# Patient Record
Sex: Female | Born: 1955 | Race: White | Hispanic: No | Marital: Married | Smoking: Never smoker
Health system: Southern US, Community
[De-identification: ages and names within clinical notes are randomized; demographics above are authoritative.]

## PROBLEM LIST (undated history)

## (undated) DIAGNOSIS — D469 Myelodysplastic syndrome, unspecified: Secondary | ICD-10-CM

## (undated) DIAGNOSIS — M1990 Unspecified osteoarthritis, unspecified site: Secondary | ICD-10-CM

## (undated) HISTORY — PX: TOTAL HIP ARTHROPLASTY: SHX124

---

## 2016-08-09 DIAGNOSIS — S73005A Unspecified dislocation of left hip, initial encounter: Secondary | ICD-10-CM | POA: Diagnosis not present

## 2016-08-09 DIAGNOSIS — T148XXA Other injury of unspecified body region, initial encounter: Secondary | ICD-10-CM | POA: Diagnosis not present

## 2016-08-10 ENCOUNTER — Encounter (HOSPITAL_COMMUNITY): Payer: Self-pay | Admitting: Emergency Medicine

## 2016-08-10 ENCOUNTER — Emergency Department (HOSPITAL_COMMUNITY): Payer: 59

## 2016-08-10 ENCOUNTER — Emergency Department (HOSPITAL_COMMUNITY)
Admission: EM | Admit: 2016-08-10 | Discharge: 2016-08-10 | Disposition: A | Discharge status: Home / Self Care | Payer: 59 | Attending: Emergency Medicine | Admitting: Emergency Medicine

## 2016-08-10 DIAGNOSIS — Y92002 Bathroom of unspecified non-institutional (private) residence single-family (private) house as the place of occurrence of the external cause: Secondary | ICD-10-CM | POA: Insufficient documentation

## 2016-08-10 DIAGNOSIS — Y999 Unspecified external cause status: Secondary | ICD-10-CM | POA: Insufficient documentation

## 2016-08-10 DIAGNOSIS — Z96649 Presence of unspecified artificial hip joint: Secondary | ICD-10-CM | POA: Diagnosis not present

## 2016-08-10 DIAGNOSIS — Y9389 Activity, other specified: Secondary | ICD-10-CM | POA: Diagnosis not present

## 2016-08-10 DIAGNOSIS — S73005A Unspecified dislocation of left hip, initial encounter: Secondary | ICD-10-CM | POA: Insufficient documentation

## 2016-08-10 DIAGNOSIS — S79912A Unspecified injury of left hip, initial encounter: Secondary | ICD-10-CM | POA: Diagnosis present

## 2016-08-10 DIAGNOSIS — X509XXA Other and unspecified overexertion or strenuous movements or postures, initial encounter: Secondary | ICD-10-CM | POA: Diagnosis not present

## 2016-08-10 DIAGNOSIS — IMO0001 Reserved for inherently not codable concepts without codable children: Secondary | ICD-10-CM

## 2016-08-10 DIAGNOSIS — Z79899 Other long term (current) drug therapy: Secondary | ICD-10-CM | POA: Diagnosis not present

## 2016-08-10 HISTORY — DX: Unspecified osteoarthritis, unspecified site: M19.90

## 2016-08-10 LAB — URINALYSIS, ROUTINE W REFLEX MICROSCOPIC
Bilirubin Urine: NEGATIVE
Glucose, UA: NEGATIVE mg/dL
HGB URINE DIPSTICK: NEGATIVE
KETONES UR: NEGATIVE mg/dL
LEUKOCYTES UA: NEGATIVE
Nitrite: NEGATIVE
PROTEIN: NEGATIVE mg/dL
Specific Gravity, Urine: 1.01 (ref 1.005–1.030)
pH: 6 (ref 5.0–8.0)

## 2016-08-10 MED ORDER — SODIUM CHLORIDE 0.9 % IV BOLUS (SEPSIS)
500.0000 mL | INTRAVENOUS | Status: AC
  Administered 2016-08-10: 500 mL via INTRAVENOUS

## 2016-08-10 MED ORDER — FENTANYL 25 MCG/HR TD PT72
50.0000 ug | MEDICATED_PATCH | TRANSDERMAL | Status: DC
  Administered 2016-08-10: 50 ug via TRANSDERMAL
  Filled 2016-08-10: qty 2

## 2016-08-10 MED ORDER — FENTANYL CITRATE (PF) 100 MCG/2ML IJ SOLN
50.0000 ug | INTRAMUSCULAR | Status: DC | PRN
  Administered 2016-08-10 (×2): 50 ug via INTRAVENOUS
  Filled 2016-08-10 (×2): qty 2

## 2016-08-10 MED ORDER — FENTANYL CITRATE (PF) 100 MCG/2ML IJ SOLN
25.0000 ug | Freq: Once | INTRAMUSCULAR | Status: AC
  Administered 2016-08-10: 25 ug via INTRAVENOUS
  Filled 2016-08-10: qty 2

## 2016-08-10 NOTE — ED Provider Notes (Signed)
[facility] DEPT Provider Note   CSN: [id number] Arrival date & time: 08/10/16  [id number]     History   Chief Complaint Chief Complaint  Patient presents with  . Hip Injury    HPI Darlene Fuller is a 61 y.o. female.  HPI  Patient presents with left hip pain. Patient has a notable history of multiple prior replacements, revisions, and had infection 4 months ago requiring drainage. However, the patient was in her usual state of health until this morning, when she felt sudden onset of pain after standing from the commode. Since that time she has had focal pain in the left hip, inability to walk, bear weight. Pain is severe, no medication taken thus far. No distal loss of sensation. No other injuries. Patient is awake and alert, provides the history herself, with the assistance of family members. In addition, during my initial evaluation the patient had her home orthopedic team physician on speaker phone. He reiterates that the patient has a complex history of hip replacement, revision, infection, and he advocates for return to [facility] for definitive care.   Past Medical History:  Diagnosis Date  . Arthritis     There are no active problems to display for this patient.   Past Surgical History:  Procedure Laterality Date  . TOTAL HIP ARTHROPLASTY      OB History    No data available       Home Medications    Prior to Admission medications   Medication Sig Start Date End Date Taking? Authorizing Provider  Apremilast (OTEZLA) 30 MG TABS Take 30 mg by mouth 2 (two) times daily.   Yes Historical Provider, MD  busPIRone (BUSPAR) 30 MG tablet Take 30 mg by mouth 2 (two) times daily.   Yes Historical Provider, MD  calcium-vitamin D (OSCAL WITH D) 500-200 MG-UNIT tablet Take 1 tablet by mouth daily with breakfast.   Yes Historical Provider, MD  cefadroxil (DURICEF) 500 MG capsule Take 500 mg by mouth 2 (two) times daily.   Yes Historical Provider, MD  metoprolol (LOPRESSOR)  50 MG tablet Take 50 mg by mouth 2 (two) times daily.   Yes Historical Provider, MD  Multiple Vitamin (MULTIVITAMIN WITH MINERALS) TABS tablet Take 1 tablet by mouth daily.   Yes Historical Provider, MD  pantoprazole (PROTONIX) 40 MG tablet Take 40 mg by mouth daily.   Yes Historical Provider, MD  QUEtiapine (SEROQUEL XR) 300 MG 24 hr tablet Take 300 mg by mouth at bedtime.   Yes Historical Provider, MD  QUEtiapine (SEROQUEL) 300 MG tablet Take 300 mg by mouth at bedtime.   Yes Historical Provider, MD  traMADol (ULTRAM) 50 MG tablet Take 50 mg by mouth every 6 (six) hours as needed for moderate pain.   Yes Historical Provider, MD    Family History No family history on file.  Social History Social History  Substance Use Topics  . Smoking status: Never Smoker  . Smokeless tobacco: Never Used  . Alcohol use Yes     Comment: "occasional"      Allergies   Patient has no known allergies.   Review of Systems Review of Systems  Constitutional:       Per HPI, otherwise negative  HENT:       Per HPI, otherwise negative  Respiratory:       Per HPI, otherwise negative  Cardiovascular:       Per HPI, otherwise negative  Gastrointestinal: Negative for vomiting.  Endocrine:  Psoriatic arthritis, myelodysplasia, neither with active treatment  Genitourinary:       Neg aside from HPI   Musculoskeletal:       Per HPI, otherwise negative  Skin: Negative.   Neurological: Negative for syncope.     Physical Exam Updated Vital Signs BP 113/72 (BP Location: Right Arm)   Pulse 77   Temp 97.5 F (36.4 C) (Oral)   Resp 12   Ht 5\' 7"  (1.702 m)   Wt 145 lb (65.8 kg)   SpO2 100%   BMI 22.71 kg/m   Physical Exam  Constitutional: She is oriented to person, place, and time. She appears well-developed and well-nourished. No distress.  HENT:  Head: Normocephalic and atraumatic.  Eyes: Conjunctivae and EOM are normal.  Cardiovascular: Normal rate and regular rhythm.     Pulmonary/Chest: Effort normal and breath sounds normal. No stridor. No respiratory distress.  Abdominal: She exhibits no distension.  Musculoskeletal: She exhibits deformity. She exhibits no edema.  L hip w two drains in place, sero-sang drainage.  Neurological: She is alert and oriented to person, place, and time. No cranial nerve deficit.  L LE distally n/v intact  Skin: Skin is warm and dry.  Psychiatric: She has a normal mood and affect.  Nursing note and vitals reviewed.    ED Treatments / Results   EKG  EKG Interpretation  Date/Time:  Sunday August 10 2016 07:58:02 EST Ventricular Rate:  68 PR Interval:    QRS Duration: 101 QT Interval:  393 QTC Calculation: 418 R Axis:   37 Text Interpretation:  Sinus rhythm RSR' in V1 or V2, probably normal variant Artifact Borderline Confirmed by Carmin Muskrat  MD [phone]) on 08/10/2016 8:22:09 AM       Radiology Dg Hip Unilat W Or Wo Pelvis 2-3 Views Left  Result Date: 08/10/2016 CLINICAL DATA:  "LEFT hip popped out of place" while sitting on the toilet this morning, has happened 5 times in past, post LEFT THR, throbbing pain all over LEFT hip EXAM: DG HIP (WITH OR WITHOUT PELVIS) 2-3V LEFT COMPARISON:  Non FINDINGS: Diffuse osseous demineralization. BILATERAL hip prostheses. Superolateral dislocation of the femoral component of the LEFT hip prosthesis. Displaced liner ring of LEFT hip prosthesis. RIGHT hip prosthesis appears normally located. No fracture or bone destruction. Pelvis appears intact. SI joints symmetric. Degenerative changes at visualized lower lumbar spine. IMPRESSION: Superolateral dislocation of LEFT hip prosthesis with displaced of the liner ring at the LEFT hip prosthesis. Electronically Signed   By: Lavonia Dana M.D.   On: 08/10/2016 08:57    Procedures Procedures (including critical care time)  Medications Ordered in ED Medications  fentaNYL (SUBLIMAZE) injection 25 mcg (not administered)  sodium chloride  0.9 % bolus 500 mL (not administered)     Initial Impression / Assessment and Plan / ED Course  I have reviewed the triage vital signs and the nursing notes.  Pertinent labs & imaging results that were available during my care of the patient were reviewed by me and considered in my medical decision making (see chart for details).  After the initial evaluation and discussion with the patient and her husband, I reviewed the XR.  With obvious dislocation, complicated by displaced (broken?) liner ring I then discussed her presentation with our orthopedist, and we reviewed the images.  There is no similar set of hardware here making revision in this facility not an option.  We, together with the patient, her husband and her orthopedist discussed options for reduction,  and it was agreed that the patient would return to [facility] for the procedure.  Patient and her husband were both aware of and in favor of this plan.  Her home orthopedic team accepted the patient for transfer.  Subsequently we spent a considerable amount of time (with much assistance) identifying possible transfer mechanisms.  Eventually family decided to go via private vehicle.  Throughout this period the patient had decent pain control with narcotics.  Prior to transfer she had a fentanyl patch applied and was provided a prescription for additional meds, if needed en route.  Final Clinical Impressions(s) / ED Diagnoses  Dislocation of hip, prosthetic, L.   Carmin Muskrat, MD 08/11/16 [phone]

## 2016-08-10 NOTE — ED Notes (Signed)
Pt attempting to urinate with female urinal.

## 2016-08-10 NOTE — ED Triage Notes (Signed)
Pt states she got up this am to use the bathroom, bent over and felt her left hip dislocate. Per pt, she has had multiple surgeries related to her hip. Pt has JP drains in place in left hip for infection. Left leg foot rotated inward.

## 2016-08-10 NOTE — Consult Note (Signed)
Reason for Consult: Left hip dislocation Referring Physician: Dr [name]  61 y.o. female.  HPI: Darlene Fuller is a 61 year old female with psoriatic arthritis who underwent left total hip replacement in 2014.  2 weeks after that hip replacement she sustained a fall with periprosthetic fracture.  Femur was revised to a long stem at that time.  She subsequently developed episodes of dislocation within a month or 2 after having that long stem prosthesis placed.  After several dislocations and reductions the patient underwent revision of the cup as well as placement of a constrained liner.  The patient developed an infection in the left hip in October of this year.  She underwent I&D with drain placement.  She is currently on oral antibiotics.  She has had a drain placed and removed several times currently the drain is in place.  She was getting up from the bathroom this morning when she sustained a dislocation.  She presents now for further management.  Her husband is here with her.  She is visiting from [place] where she has received all of her treatment.  The patient does live in [place].  I have discussed this case with the resident of the attending of record at [facility].   Past Medical History:  Diagnosis Date  . Arthritis     Past Surgical History:  Procedure Laterality Date  . TOTAL HIP ARTHROPLASTY      No family history on file.  Social History:  reports that she has never smoked. She has never used smokeless tobacco. She reports that she drinks alcohol. Her drug history is not on file.  Allergies: No Known Allergies  Medications: I have reviewed the patient's current medications.  No results found for this or any previous visit (from the past 48 hour(s)).  Dg Hip Unilat W Or Wo Pelvis 2-3 Views Left  Result Date: 08/10/2016 CLINICAL DATA:  "LEFT hip popped out of place" while sitting on the toilet this morning, has happened 5 times in past,  post LEFT THR, throbbing pain all over LEFT hip EXAM: DG HIP (WITH OR WITHOUT PELVIS) 2-3V LEFT COMPARISON:  Non FINDINGS: Diffuse osseous demineralization. BILATERAL hip prostheses. Superolateral dislocation of the femoral component of the LEFT hip prosthesis. Displaced liner ring of LEFT hip prosthesis. RIGHT hip prosthesis appears normally located. No fracture or bone destruction. Pelvis appears intact. SI joints symmetric. Degenerative changes at visualized lower lumbar spine. IMPRESSION: Superolateral dislocation of LEFT hip prosthesis with displaced of the liner ring at the LEFT hip prosthesis. Electronically Signed   By: Darlene Fuller M.D.   On: 08/10/2016 08:57    Review of Systems  Musculoskeletal: Positive for joint pain.  All other systems reviewed and are negative.  Blood pressure 104/72, pulse 64, temperature 97.5 F (36.4 C), temperature source Oral, resp. rate 11, height 5\' 7"  (1.702 m), weight 145 lb (65.8 kg), SpO2 99 %. Physical Exam  Constitutional: She appears well-developed.  HENT:  Head: Normocephalic.  Eyes: Pupils are equal, round, and reactive to light.  Neck: Normal range of motion.  Cardiovascular: Normal rate.   Respiratory: Effort normal.  Neurological: She is alert.  Skin: Skin is warm.  Psychiatric: She has a normal mood and affect.  Examination of the left leg demonstrates shortening and internal rotation.  Ankle dorsiflexion plantar flexion is intact.  Pedal pulses palpable.  Incision is intact with 2 drains coming out.  There is no redness fluctuance or erythema around the incision.  Assessment/Plan:  Plan at this time is for transfer to [place].  I discussed this at length with Darlene Fuller who is the resident on the service at this time.  Treatment of this problem will be very complicated with removal of well fixed long stem curved stem as well as possible cup revision.  There is also the complicating factor of  infection for which drains are in place.  We don't really have that level of expertise here to manage this particular complicated problem.  She would be best served being transferred today for admission and subsequent treatment.  Once we arrived at suitable transport I will call and let the resident note that she is coming.  She is agreeable to the plan.  Foley catheter will be placed for comfort along with oral pain medicines.  Darlene Fuller 08/10/2016, 12:22 PM

## 2016-08-10 NOTE — ED Notes (Signed)
Dr Vanita Panda at bedside speaking with family.

## 2016-08-10 NOTE — ED Notes (Signed)
Pt states she only has pain with movement, comfortable at this time.

## 2018-05-31 ENCOUNTER — Other Ambulatory Visit: Payer: Self-pay

## 2018-05-31 ENCOUNTER — Encounter (HOSPITAL_COMMUNITY): Payer: Self-pay

## 2018-05-31 ENCOUNTER — Emergency Department (HOSPITAL_COMMUNITY)
Admission: EM | Admit: 2018-05-31 | Discharge: 2018-05-31 | Disposition: A | Discharge status: Home / Self Care | Payer: BLUE CROSS/BLUE SHIELD | Attending: Emergency Medicine | Admitting: Emergency Medicine

## 2018-05-31 DIAGNOSIS — D469 Myelodysplastic syndrome, unspecified: Secondary | ICD-10-CM | POA: Insufficient documentation

## 2018-05-31 DIAGNOSIS — Z79899 Other long term (current) drug therapy: Secondary | ICD-10-CM | POA: Diagnosis not present

## 2018-05-31 DIAGNOSIS — Z452 Encounter for adjustment and management of vascular access device: Secondary | ICD-10-CM

## 2018-05-31 DIAGNOSIS — L4050 Arthropathic psoriasis, unspecified: Secondary | ICD-10-CM | POA: Diagnosis not present

## 2018-05-31 HISTORY — DX: Myelodysplastic syndrome, unspecified: D46.9

## 2018-05-31 LAB — COMPREHENSIVE METABOLIC PANEL
ALBUMIN: 3.5 g/dL (ref 3.5–5.0)
ALK PHOS: 95 U/L (ref 38–126)
ALT: 17 U/L (ref 0–44)
AST: 33 U/L (ref 15–41)
Anion gap: 12 (ref 5–15)
BUN: 19 mg/dL (ref 8–23)
CALCIUM: 9.5 mg/dL (ref 8.9–10.3)
CO2: 26 mmol/L (ref 22–32)
CREATININE: 0.83 mg/dL (ref 0.44–1.00)
Chloride: 101 mmol/L (ref 98–111)
GFR calc Af Amer: >60 mL/min (ref >60)
GFR calc non Af Amer: >60 mL/min (ref >60)
GLUCOSE: 93 mg/dL (ref 70–99)
Potassium: 4.3 mmol/L (ref 3.5–5.1)
Sodium: 139 mmol/L (ref 135–145)
Total Bilirubin: 0.5 mg/dL (ref 0.3–1.2)
Total Protein: 7.4 g/dL (ref 6.5–8.1)

## 2018-05-31 LAB — CBC WITH DIFFERENTIAL/PLATELET
Abs Immature Granulocytes: 0.04 10*3/uL (ref 0.00–0.07)
Basophils Absolute: 0.1 10*3/uL (ref 0.0–0.1)
Basophils Relative: 1 %
EOS ABS: 0.5 10*3/uL (ref 0.0–0.5)
Eosinophils Relative: 7 %
HEMATOCRIT: 33.3 % — AB (ref 36.0–46.0)
Hemoglobin: 9.4 g/dL — ABNORMAL LOW (ref 12.0–15.0)
IMMATURE GRANULOCYTES: 1 %
LYMPHS ABS: 2.4 10*3/uL (ref 0.7–4.0)
Lymphocytes Relative: 32 %
MCH: 22.9 pg — ABNORMAL LOW (ref 26.0–34.0)
MCHC: 28.2 g/dL — ABNORMAL LOW (ref 30.0–36.0)
MCV: 81.2 fL (ref 80.0–100.0)
MONOS PCT: 8 %
Monocytes Absolute: 0.6 10*3/uL (ref 0.1–1.0)
Neutro Abs: 3.8 10*3/uL (ref 1.7–7.7)
Neutrophils Relative %: 51 %
Platelets: 620 10*3/uL — ABNORMAL HIGH (ref 150–400)
RBC: 4.1 MIL/uL (ref 3.87–5.11)
RDW: 14.8 % (ref 11.5–15.5)
WBC: 7.3 10*3/uL (ref 4.0–10.5)
nRBC: 0 % (ref 0.0–0.2)

## 2018-05-31 LAB — SEDIMENTATION RATE: Sed Rate: 55 mm/hr — ABNORMAL HIGH (ref 0–22)

## 2018-05-31 LAB — CK: CK TOTAL: 192 U/L (ref 38–234)

## 2018-05-31 LAB — C-REACTIVE PROTEIN: CRP: 1.8 mg/dL — ABNORMAL HIGH (ref <1.0)

## 2018-05-31 MED ORDER — HEPARIN SOD (PORK) LOCK FLUSH 100 UNIT/ML IV SOLN
250.0000 [IU] | INTRAVENOUS | Status: DC | PRN
  Filled 2018-05-31: qty 2.5

## 2018-05-31 NOTE — Discharge Instructions (Signed)
As discussed, your evaluation today has been largely reassuring.  But, it is important that you monitor your condition carefully, and do not hesitate to return to the ED if you develop new, or concerning changes in your condition. ? ?Otherwise, please follow-up with your physician for appropriate ongoing care. ? ?

## 2018-05-31 NOTE — ED Triage Notes (Signed)
Pt presents for routine PICC line care. Pt states she is from [facility] but is visiting her sick mother here in the hospital. Pt states that normally her home health nurse takes care of it. Pt states she has the PICC for antibiotic therapy for an infected his replacement. 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 Pt ambulatory with steady gait.

## 2018-05-31 NOTE — ED Provider Notes (Signed)
[facility] EMERGENCY DEPARTMENT Provider Note   CSN: [id number] Arrival date & time: 05/31/18  1319     History   Chief Complaint Chief Complaint  Patient presents with  . Follow-up    HPI Darlene Fuller is a 62 y.o. female.  HPI  Patient presents with [facility] for attention to her PICC line dressing, as well as blood draw. Patient has multiple medical issues, and indeed, I have seen this patient before, about 1 year ago. Patient has history of psoriatic arthritis, myelodysplastic syndrome, and left hip arthroplasty with complications. When I saw the patient 1 year ago, patient had dislocation of the hip. She notes that she has been diagnosed with infected hip, and is currently receiving daptomycin and rifampin for infection. She has a PICC line in place in her right arm, and is due for dressing change today. She denies interval changes including fever, nausea, vomiting, new pain. Patient is also scheduled for blood draw, today. Patient is in from out of town, has no Field seismologist, or care team.   Past Medical History:  Diagnosis Date  . Arthritis     There are no active problems to display for this patient.   Past Surgical History:  Procedure Laterality Date  . TOTAL HIP ARTHROPLASTY       OB History   None      Home Medications    Prior to Admission medications   Medication Sig Start Date End Date Taking? Authorizing Provider  Apremilast (OTEZLA) 30 MG TABS Take 30 mg by mouth 2 (two) times daily.    [provider]  busPIRone (BUSPAR) 30 MG tablet Take 30 mg by mouth 2 (two) times daily.    [provider]  calcium-vitamin D (OSCAL WITH D) 500-200 MG-UNIT tablet Take 1 tablet by mouth daily with breakfast.    [provider]  cefadroxil (DURICEF) 500 MG capsule Take 500 mg by mouth 2 (two) times daily.    [provider]  metoprolol (LOPRESSOR) 50 MG tablet Take 50 mg by mouth 2 (two) times daily.     [provider]  Multiple Vitamin (MULTIVITAMIN WITH MINERALS) TABS tablet Take 1 tablet by mouth daily.    [provider]  pantoprazole (PROTONIX) 40 MG tablet Take 40 mg by mouth daily.    [provider]  QUEtiapine (SEROQUEL XR) 300 MG 24 hr tablet Take 300 mg by mouth at bedtime.    [provider]  QUEtiapine (SEROQUEL) 300 MG tablet Take 300 mg by mouth at bedtime.    [provider]  traMADol (ULTRAM) 50 MG tablet Take 50 mg by mouth every 6 (six) hours as needed for moderate pain.    [provider]    Family History No family history on file.  Social History Social History   Tobacco Use  . Smoking status: Never Smoker  . Smokeless tobacco: Never Used  Substance Use Topics  . Alcohol use: Yes    Comment: "occasional"   . Drug use: Not on file     Allergies   Patient has no known allergies.   Review of Systems Review of Systems  Allergic/Immunologic: Positive for immunocompromised state.  Hematological:       MDS     Physical Exam Updated Vital Signs There were no vitals taken for this visit.  Physical Exam  Constitutional: She is oriented to person, place, and time. She appears well-developed and well-nourished. No distress.  HENT:  Head: Normocephalic and  atraumatic.  Eyes: Conjunctivae are normal.  Pulmonary/Chest: Effort normal.  Musculoskeletal: She exhibits no deformity.  Neurological: She is alert and oriented to person, place, and time. No cranial nerve deficit.  Skin: Skin is warm and dry.  No evidence of infection, R PICC  Psychiatric: She has a normal mood and affect.  Nursing note and vitals reviewed.    ED Treatments / Results  Labs (all labs ordered are listed, but only abnormal results are displayed) Labs Reviewed  CBC WITH DIFFERENTIAL/PLATELET - Abnormal; Notable for the following components:      Result Value   Hemoglobin 9.4 (*)    HCT 33.3 (*)    MCH 22.9 (*)    MCHC 28.2  (*)    Platelets 620 (*)    All other components within normal limits  COMPREHENSIVE METABOLIC PANEL  CK  C-REACTIVE PROTEIN  SEDIMENTATION RATE     Procedures Procedures (including critical care time)  Medications Ordered in ED Medications  heparin lock flush 100 unit/mL (has no administration in time range)     Initial Impression / Assessment and Plan / ED Course  I have reviewed the triage vital signs and the nursing notes.  Pertinent labs & imaging results that were available during my care of the patient were reviewed by me and considered in my medical decision making (see chart for details).   Well-appearing female with multiple medical issues including infected left hip arthroplasty, myelodysplastic syndrome, and psoriatic arthritis presents with request for wound care of her right PICC line, and blood draw Labs reassuring, and given her denial of new complaints, low suspicion for acute new pathology. Patient also acknowledges ongoing compliance with her medication regimen. Patient received dressing change, blood draw, and with reassuring vital signs, she was discharged to follow-up with her primary care team in [place].  Final Clinical Impressions(s) / ED Diagnoses  Encounter for assessment of PICC   Carmin Muskrat, MD 05/31/18 1506

## 2018-06-07 ENCOUNTER — Other Ambulatory Visit: Payer: Self-pay

## 2018-06-07 ENCOUNTER — Emergency Department (HOSPITAL_COMMUNITY)
Admission: EM | Admit: 2018-06-07 | Discharge: 2018-06-07 | Disposition: A | Discharge status: Home / Self Care | Payer: BLUE CROSS/BLUE SHIELD | Attending: Emergency Medicine | Admitting: Emergency Medicine

## 2018-06-07 ENCOUNTER — Encounter (HOSPITAL_COMMUNITY): Payer: Self-pay

## 2018-06-07 DIAGNOSIS — R2681 Unsteadiness on feet: Secondary | ICD-10-CM | POA: Diagnosis present

## 2018-06-07 DIAGNOSIS — D469 Myelodysplastic syndrome, unspecified: Secondary | ICD-10-CM | POA: Insufficient documentation

## 2018-06-07 DIAGNOSIS — D649 Anemia, unspecified: Secondary | ICD-10-CM

## 2018-06-07 DIAGNOSIS — Z79899 Other long term (current) drug therapy: Secondary | ICD-10-CM | POA: Diagnosis not present

## 2018-06-07 DIAGNOSIS — Z96649 Presence of unspecified artificial hip joint: Secondary | ICD-10-CM | POA: Insufficient documentation

## 2018-06-07 LAB — CBC WITH DIFFERENTIAL/PLATELET
Abs Immature Granulocytes: 0.02 10*3/uL (ref 0.00–0.07)
BASOS PCT: 1 %
Basophils Absolute: 0.1 10*3/uL (ref 0.0–0.1)
Eosinophils Absolute: 0.7 10*3/uL — ABNORMAL HIGH (ref 0.0–0.5)
Eosinophils Relative: 10 %
HCT: 31.9 % — ABNORMAL LOW (ref 36.0–46.0)
Hemoglobin: 9.1 g/dL — ABNORMAL LOW (ref 12.0–15.0)
Immature Granulocytes: 0 %
Lymphocytes Relative: 37 %
Lymphs Abs: 2.4 10*3/uL (ref 0.7–4.0)
MCH: 22.5 pg — ABNORMAL LOW (ref 26.0–34.0)
MCHC: 28.5 g/dL — ABNORMAL LOW (ref 30.0–36.0)
MCV: 78.8 fL — ABNORMAL LOW (ref 80.0–100.0)
Monocytes Absolute: 0.4 10*3/uL (ref 0.1–1.0)
Monocytes Relative: 6 %
Neutro Abs: 3 10*3/uL (ref 1.7–7.7)
Neutrophils Relative %: 46 %
PLATELETS: 460 10*3/uL — AB (ref 150–400)
RBC: 4.05 MIL/uL (ref 3.87–5.11)
RDW: 14.6 % (ref 11.5–15.5)
WBC: 6.5 10*3/uL (ref 4.0–10.5)
nRBC: 0 % (ref 0.0–0.2)

## 2018-06-07 LAB — CK: Total CK: 177 U/L (ref 38–234)

## 2018-06-07 LAB — COMPREHENSIVE METABOLIC PANEL
ALK PHOS: 86 U/L (ref 38–126)
ALT: 20 U/L (ref 0–44)
AST: 36 U/L (ref 15–41)
Albumin: 3.4 g/dL — ABNORMAL LOW (ref 3.5–5.0)
Anion gap: 12 (ref 5–15)
BUN: 23 mg/dL (ref 8–23)
CO2: 27 mmol/L (ref 22–32)
Calcium: 9.5 mg/dL (ref 8.9–10.3)
Chloride: 99 mmol/L (ref 98–111)
Creatinine, Ser: 0.73 mg/dL (ref 0.44–1.00)
GFR calc Af Amer: >60 mL/min (ref >60)
GFR calc non Af Amer: >60 mL/min (ref >60)
Glucose, Bld: 98 mg/dL (ref 70–99)
Potassium: 3.8 mmol/L (ref 3.5–5.1)
SODIUM: 138 mmol/L (ref 135–145)
Total Bilirubin: 0.2 mg/dL — ABNORMAL LOW (ref 0.3–1.2)
Total Protein: 6.9 g/dL (ref 6.5–8.1)

## 2018-06-07 LAB — SEDIMENTATION RATE: Sed Rate: 47 mm/hr — ABNORMAL HIGH (ref 0–22)

## 2018-06-07 LAB — C-REACTIVE PROTEIN: CRP: <0.8 mg/dL (ref <1.0)

## 2018-06-07 NOTE — ED Triage Notes (Signed)
Pt here for weekly PICC line dressing change and weekly labs. Pt here for same last week and results faxed to her doctor in [place]. Pt visiting sick mother here and cannot get to her doctor in [place].

## 2018-06-07 NOTE — Discharge Instructions (Addendum)
Hemoglobin is dropped a very small amount (9.4-9.1).  Otherwise, labs are stable.  They were faxed to your doctor in [place].

## 2018-06-07 NOTE — ED Notes (Signed)
Pt stable, ambulatory, states understanding of discharge instructions 

## 2018-06-08 NOTE — ED Provider Notes (Signed)
[facility] EMERGENCY DEPARTMENT Provider Note   CSN: [id number] Arrival date & time: 06/07/18  1611     History   Chief Complaint Chief Complaint  Patient presents with  . Vascular Access Problem    HPI Darlene Fuller is a 62 y.o. female.  Patient is here from [place], [place] to get blood work and an assessment of her PICC line insertion.  She has known myelodysplastic syndrome and has had a recent hip replacement which has been infected.  She is receiving intravenous antibiotics.  She has no specific complaints today.     Past Medical History:  Diagnosis Date  . Arthritis   . MDS (myelodysplastic syndrome) (HCC)     There are no active problems to display for this patient.   Past Surgical History:  Procedure Laterality Date  . TOTAL HIP ARTHROPLASTY       OB History   No obstetric history on file.      Home Medications    Prior to Admission medications   Medication Sig Start Date End Date Taking? Authorizing Provider  Apremilast (OTEZLA) 30 MG TABS Take 30 mg by mouth 2 (two) times daily.    [provider]  busPIRone (BUSPAR) 30 MG tablet Take 30 mg by mouth 2 (two) times daily.    [provider]  calcium-vitamin D (OSCAL WITH D) 500-200 MG-UNIT tablet Take 1 tablet by mouth daily with breakfast.    [provider]  cefadroxil (DURICEF) 500 MG capsule Take 500 mg by mouth 2 (two) times daily.    [provider]  metoprolol (LOPRESSOR) 50 MG tablet Take 50 mg by mouth 2 (two) times daily.    [provider]  Multiple Vitamin (MULTIVITAMIN WITH MINERALS) TABS tablet Take 1 tablet by mouth daily.    [provider]  pantoprazole (PROTONIX) 40 MG tablet Take 40 mg by mouth daily.    [provider]  QUEtiapine (SEROQUEL XR) 300 MG 24 hr tablet Take 300 mg by mouth at bedtime.    [provider]  QUEtiapine (SEROQUEL) 300 MG tablet Take 300 mg by mouth at bedtime.     [provider]  traMADol (ULTRAM) 50 MG tablet Take 50 mg by mouth every 6 (six) hours as needed for moderate pain.    [provider]    Family History No family history on file.  Social History Social History   Tobacco Use  . Smoking status: Never Smoker  . Smokeless tobacco: Never Used  Substance Use Topics  . Alcohol use: Yes    Comment: "occasional"   . Drug use: Not on file     Allergies   Patient has no known allergies.   Review of Systems Review of Systems  All other systems reviewed and are negative.    Physical Exam Updated Vital Signs BP 135/77 (BP Location: Left Arm)   Pulse 86   Temp 98.1 F (36.7 C) (Oral)   Resp 16   Ht 5\' 7"  (1.702 m)   Wt 56.7 kg   SpO2 100%   BMI 19.58 kg/m   Physical Exam Vitals signs and nursing note reviewed.  Constitutional:      Appearance: She is well-developed.  HENT:     Head: Normocephalic and atraumatic.  Eyes:     Conjunctiva/sclera: Conjunctivae normal.  Cardiovascular:     Rate and Rhythm: Normal rate.  Pulmonary:     Effort: Pulmonary effort is normal.  Musculoskeletal: Normal range of motion.  Comments: Slight limp on left leg  Skin:    General: Skin is warm and dry.  Neurological:     Mental Status: She is alert and oriented to person, place, and time.  Psychiatric:        Behavior: Behavior normal.      ED Treatments / Results  Labs (all labs ordered are listed, but only abnormal results are displayed) Labs Reviewed  CBC WITH DIFFERENTIAL/PLATELET - Abnormal; Notable for the following components:      Result Value   Hemoglobin 9.1 (*)    HCT 31.9 (*)    MCV 78.8 (*)    MCH 22.5 (*)    MCHC 28.5 (*)    Platelets 460 (*)    Eosinophils Absolute 0.7 (*)    All other components within normal limits  COMPREHENSIVE METABOLIC PANEL - Abnormal; Notable for the following components:   Albumin 3.4 (*)    Total Bilirubin 0.2 (*)    All other components within normal  limits  SEDIMENTATION RATE - Abnormal; Notable for the following components:   Sed Rate 47 (*)    All other components within normal limits  C-REACTIVE PROTEIN  CK    EKG None  Radiology No results found.  Procedures Procedures (including critical care time)  Medications Ordered in ED Medications - No data to display   Initial Impression / Assessment and Plan / ED Course  I have reviewed the triage vital signs and the nursing notes.  Pertinent labs & imaging results that were available during my care of the patient were reviewed by me and considered in my medical decision making (see chart for details).     Physical exam normal.  Lab tests were reviewed and are acceptable.  Test results were faxed to her doctor in [place] as she requested.   Final Clinical Impressions(s) / ED Diagnoses   Final diagnoses:  Myelodysplastic syndrome ([place])  Anemia, unspecified type    ED Discharge Orders    None       Nat Christen, MD 06/10/18 [phone]

## 2018-06-11 ENCOUNTER — Emergency Department (HOSPITAL_COMMUNITY): Payer: BLUE CROSS/BLUE SHIELD

## 2018-06-11 ENCOUNTER — Encounter (HOSPITAL_COMMUNITY): Payer: Self-pay | Admitting: *Deleted

## 2018-06-11 ENCOUNTER — Emergency Department (HOSPITAL_COMMUNITY)
Admission: EM | Admit: 2018-06-11 | Discharge: 2018-06-11 | Disposition: A | Discharge status: Home / Self Care | Payer: BLUE CROSS/BLUE SHIELD | Attending: Emergency Medicine | Admitting: Emergency Medicine

## 2018-06-11 DIAGNOSIS — D649 Anemia, unspecified: Secondary | ICD-10-CM | POA: Diagnosis not present

## 2018-06-11 DIAGNOSIS — I499 Cardiac arrhythmia, unspecified: Secondary | ICD-10-CM

## 2018-06-11 DIAGNOSIS — R5383 Other fatigue: Secondary | ICD-10-CM | POA: Diagnosis present

## 2018-06-11 LAB — ABO/RH: ABO/RH(D): O POS

## 2018-06-11 LAB — FERRITIN: Ferritin: 15 ng/mL (ref 11–307)

## 2018-06-11 LAB — CBC WITH DIFFERENTIAL/PLATELET
Abs Immature Granulocytes: 0.03 10*3/uL (ref 0.00–0.07)
BASOS PCT: 1 %
Basophils Absolute: 0.1 10*3/uL (ref 0.0–0.1)
Eosinophils Absolute: 0.6 10*3/uL — ABNORMAL HIGH (ref 0.0–0.5)
Eosinophils Relative: 9 %
HCT: 30.9 % — ABNORMAL LOW (ref 36.0–46.0)
Hemoglobin: 8.7 g/dL — ABNORMAL LOW (ref 12.0–15.0)
Immature Granulocytes: 0 %
Lymphocytes Relative: 40 %
Lymphs Abs: 2.7 10*3/uL (ref 0.7–4.0)
MCH: 22.1 pg — AB (ref 26.0–34.0)
MCHC: 28.2 g/dL — ABNORMAL LOW (ref 30.0–36.0)
MCV: 78.6 fL — ABNORMAL LOW (ref 80.0–100.0)
Monocytes Absolute: 0.5 10*3/uL (ref 0.1–1.0)
Monocytes Relative: 7 %
Neutro Abs: 2.9 10*3/uL (ref 1.7–7.7)
Neutrophils Relative %: 43 %
Platelets: 341 10*3/uL (ref 150–400)
RBC: 3.93 MIL/uL (ref 3.87–5.11)
RDW: 14.6 % (ref 11.5–15.5)
WBC: 6.8 10*3/uL (ref 4.0–10.5)
nRBC: 0 % (ref 0.0–0.2)

## 2018-06-11 LAB — BASIC METABOLIC PANEL
Anion gap: 11 (ref 5–15)
BUN: 25 mg/dL — ABNORMAL HIGH (ref 8–23)
CO2: 23 mmol/L (ref 22–32)
Calcium: 9.2 mg/dL (ref 8.9–10.3)
Chloride: 105 mmol/L (ref 98–111)
Creatinine, Ser: 0.55 mg/dL (ref 0.44–1.00)
GFR calc Af Amer: >60 mL/min (ref >60)
Glucose, Bld: 103 mg/dL — ABNORMAL HIGH (ref 70–99)
POTASSIUM: 4.1 mmol/L (ref 3.5–5.1)
Sodium: 139 mmol/L (ref 135–145)

## 2018-06-11 LAB — IRON AND TIBC
Iron: 20 ug/dL — ABNORMAL LOW (ref 28–170)
Saturation Ratios: 5 % — ABNORMAL LOW (ref 10.4–31.8)
TIBC: 364 ug/dL (ref 250–450)
UIBC: 344 ug/dL

## 2018-06-11 LAB — RETICULOCYTES
Immature Retic Fract: 18.9 % — ABNORMAL HIGH (ref 2.3–15.9)
RBC.: 3.88 MIL/uL (ref 3.87–5.11)
Retic Count, Absolute: 65.2 10*3/uL (ref 19.0–186.0)
Retic Ct Pct: 1.7 % (ref 0.4–3.1)

## 2018-06-11 LAB — FOLATE: Folate: 33.5 ng/mL (ref >5.9)

## 2018-06-11 LAB — VITAMIN B12: Vitamin B-12: 511 pg/mL (ref 180–914)

## 2018-06-11 LAB — PREPARE RBC (CROSSMATCH)

## 2018-06-11 MED ORDER — SODIUM CHLORIDE 0.9% IV SOLUTION: Freq: Once | INTRAVENOUS | Status: DC

## 2018-06-11 NOTE — ED Provider Notes (Signed)
7:10 PM-patient receiving second unit of blood currently.  Nursing noticed cardiac monitor alarm, at 1858, indicative of transient ventricular tachycardia 3 brief episodes within 5 seconds.  She was assessed immediately and a 12-lead EKG was done which showed normal sinus rhythm with normal intervals.  Patient was asymptomatic for the event.        EKG Interpretation  Date/Time:  Friday June 11 2018 19:04:53 EST Ventricular Rate:  76 PR Interval:    QRS Duration: 88 QT Interval:  392 QTC Calculation: 441 R Axis:   55 Text Interpretation:  Sinus rhythm since last tracing no significant change Confirmed by Daleen Bo [phone]) on 06/11/2018 7:14:47 PM       Patient Vitals for the past 24 hrs:  BP Temp Temp src Pulse Resp SpO2  06/11/18 2045 137/84 - - 72 (!) 21 98 %  06/11/18 2030 136/79 - - 80 (!) 23 99 %  06/11/18 2015 136/77 - - 84 18 98 %  06/11/18 1815 (!) 109/92 98.3 F (36.8 C) Oral 78 19 100 %  06/11/18 1800 131/71 98.6 F (37 C) - 77 15 -  06/11/18 1753 - - - 74 (!) 23 100 %  06/11/18 1730 136/76 - - 78 (!) 21 100 %  06/11/18 1715 129/79 - - 70 (!) 22 100 %  06/11/18 1506 128/70 98.4 F (36.9 C) Oral 75 16 100 %  06/11/18 1451 130/60 98.1 F (36.7 C) Oral 72 16 -  06/11/18 1245 134/74 - - 84 20 100 %  06/11/18 1052 (!) 145/69 (!) 97.4 F (36.3 C) Oral 76 18 100 %    At D/C Reevaluation with update and discussion. After initial assessment and treatment, an updated evaluation reveals she is comfortable has no further complaints.  Findings discussed with the patient and all questions were answered. Daleen Bo   Medical Decision Making: Patient with symptomatic anemia, secondary to mild dysplastic disorder.  Patient feels better after blood transfusion.  Incidental very short period of tachycardia possibly ventricular, which was asymptomatic during blood transfusion.  Doubt allergic reaction to blood products.  Doubt ACS, PE or pneumonia.  Patient stable for  discharge with outpatient management and cardiology consultation with possible Holter monitoring.  CRITICAL CARE-no Performed by: Daleen Bo  Nursing Notes Reviewed/ Care Coordinated Applicable Imaging Reviewed Interpretation of Laboratory Data incorporated into ED treatment  The patient appears reasonably screened and/or stabilized for discharge and I doubt any other medical condition or other [facility] requiring further screening, evaluation, or treatment in the ED at this time prior to discharge.  Plan: Home Medications-continue routine medications; Home Treatments-rest, fluids; return here if the recommended treatment, does not improve the symptoms; Recommended follow up-blood testing, in 3 days as scheduled.  Cardiology follow-up for Holter testing and further evaluation as needed.    Daleen Bo, MD 06/11/18 [phone]

## 2018-06-11 NOTE — Discharge Instructions (Addendum)
Follow-up with your usual doctors as planned.  Your blood count may improve to around 10 but will likely decrease.  You had a short episode, less than 5 seconds of cardiac arrhythmia that may be ventricular tachycardia.  Since there were no symptoms with this and it spontaneously resolved it does not have to be intervened with at this time.  If you develop dizziness that does not improve with sitting, pass out or have other symptoms that are concerning to you, please return.  It well also help to follow-up with a cardiologist.  You can call [facility] for an appointment with a cardiologist to be seen about the episode of tachycardia.

## 2018-06-11 NOTE — ED Provider Notes (Signed)
[facility] EMERGENCY DEPARTMENT Provider Note   CSN: [id number] Arrival date & time: 06/11/18  1049     History   Chief Complaint Chief Complaint  Patient presents with  . Fatigue    HPI Darlene Fuller is a 62 y.o. female.  HPI Patient is a 62 year old female who is visiting from out of town who presents the emergency department with increasing lightheadedness and generalized weakness and fatigue.  She spoke with her hematology team in [place] who is concerned in regards to her falling hemoglobin.  Her hemoglobin was checked in the ER on 06/07/2018 was found to be 9.4.  She denies melanotic stools.  Denies fevers or chills.  Denies productive cough.  No chest pain or abdominal pain.  Her last blood transfusion was 2 years ago.  She and her hematologist are concerned that she may require repeat blood transfusion Past Medical History:  Diagnosis Date  . Arthritis   . MDS (myelodysplastic syndrome) (HCC)     There are no active problems to display for this patient.   Past Surgical History:  Procedure Laterality Date  . TOTAL HIP ARTHROPLASTY       OB History   No obstetric history on file.      Home Medications    Prior to Admission medications   Medication Sig Start Date End Date Taking? Authorizing Provider  Apremilast (OTEZLA) 30 MG TABS Take 30 mg by mouth 2 (two) times daily.    [provider]  busPIRone (BUSPAR) 30 MG tablet Take 30 mg by mouth 2 (two) times daily.    [provider]  calcium-vitamin D (OSCAL WITH D) 500-200 MG-UNIT tablet Take 1 tablet by mouth daily with breakfast.    [provider]  cefadroxil (DURICEF) 500 MG capsule Take 500 mg by mouth 2 (two) times daily.    [provider]  metoprolol (LOPRESSOR) 50 MG tablet Take 50 mg by mouth 2 (two) times daily.    [provider]  Multiple Vitamin (MULTIVITAMIN WITH MINERALS) TABS tablet Take 1 tablet by mouth daily.    [provider]  pantoprazole (PROTONIX) 40 MG tablet Take 40 mg by mouth daily.    [provider]  QUEtiapine (SEROQUEL XR) 300 MG 24 hr tablet Take 300 mg by mouth at bedtime.    [provider]  QUEtiapine (SEROQUEL) 300 MG tablet Take 300 mg by mouth at bedtime.    [provider]  traMADol (ULTRAM) 50 MG tablet Take 50 mg by mouth every 6 (six) hours as needed for moderate pain.    [provider]    Family History History reviewed. No pertinent family history.  Social History Social History   Tobacco Use  . Smoking status: Never Smoker  . Smokeless tobacco: Never Used  Substance Use Topics  . Alcohol use: Yes    Comment: "occasional"   . Drug use: Not on file     Allergies   Patient has no known allergies.   Review of Systems Review of Systems  All other systems reviewed and are negative.    Physical Exam Updated Vital Signs BP 128/70 (BP Location: Left Arm)   Pulse 75   Temp 98.4 F (36.9 C) (Oral)   Resp 16   SpO2 100%   Physical Exam Vitals signs and nursing note reviewed.  Constitutional:      General: She is not in acute distress.    Appearance: She is well-developed.  HENT:  Head: Normocephalic and atraumatic.  Neck:     Musculoskeletal: Normal range of motion.  Cardiovascular:     Rate and Rhythm: Normal rate and regular rhythm.     Heart sounds: Normal heart sounds.  Pulmonary:     Effort: Pulmonary effort is normal.     Breath sounds: Normal breath sounds.  Abdominal:     General: There is no distension.     Palpations: Abdomen is soft.     Tenderness: There is no abdominal tenderness.  Musculoskeletal: Normal range of motion.  Skin:    General: Skin is warm and dry.  Neurological:     Mental Status: She is alert and oriented to person, place, and time.  Psychiatric:        Judgment: Judgment normal.      ED Treatments / Results  Labs (all labs ordered are listed, but only abnormal results  are displayed) Labs Reviewed  CBC WITH DIFFERENTIAL/PLATELET - Abnormal; Notable for the following components:      Result Value   Hemoglobin 8.7 (*)    HCT 30.9 (*)    MCV 78.6 (*)    MCH 22.1 (*)    MCHC 28.2 (*)    Eosinophils Absolute 0.6 (*)    All other components within normal limits  BASIC METABOLIC PANEL - Abnormal; Notable for the following components:   Glucose, Bld 103 (*)    BUN 25 (*)    All other components within normal limits  IRON AND TIBC - Abnormal; Notable for the following components:   Iron 20 (*)    Saturation Ratios 5 (*)    All other components within normal limits  RETICULOCYTES - Abnormal; Notable for the following components:   Immature Retic Fract 18.9 (*)    All other components within normal limits  VITAMIN B12  FOLATE  FERRITIN  TYPE AND SCREEN  ABO/RH  PREPARE RBC (CROSSMATCH)    EKG None  Radiology Dg Chest 1 View  Result Date: 06/11/2018 CLINICAL DATA:  PICC placement EXAM: CHEST  1 VIEW COMPARISON:  None. FINDINGS: Right arm PICC tip at the cavoatrial junction in good position. Lungs are clear without infiltrate effusion or edema. IMPRESSION: Right arm PICC tip at the cavoatrial junction. Electronically Signed   By: Franchot Gallo M.D.   On: 06/11/2018 12:06     Procedures .Critical Care Performed by: Jola Schmidt, MD Authorized by: Jola Schmidt, MD   Critical care provider statement:    Critical care time (minutes):  33   Critical care was time spent personally by me on the following activities:  Discussions with consultants, evaluation of patient's response to treatment, examination of patient, ordering and performing treatments and interventions, ordering and review of laboratory studies, ordering and review of radiographic studies, pulse oximetry, re-evaluation of patient's condition, obtaining history from patient or surrogate and review of old charts     Medications Ordered in ED Medications  0.9 %  sodium chloride  infusion (Manually program via Guardrails IV Fluids) (has no administration in time range)     Initial Impression / Assessment and Plan / ED Course  I have reviewed the triage vital signs and the nursing notes.  Pertinent labs & imaging results that were available during my care of the patient were reviewed by me and considered in my medical decision making (see chart for details).     Falling hemoglobin.  Likely symptomatic anemia.  Patient requesting blood transfusion.  I think this is reasonable given her  symptoms.  No other clear etiology is found for this.  Patient is requesting blood transfusion in the ER not to be admitted.  Patient will be discharged once her 2 units of blood are transfused.  She will be able to follow-up with her primary team in [place].    Final Clinical Impressions(s) / ED Diagnoses   Final diagnoses:  None    ED Discharge Orders    None       Jola Schmidt, MD 06/11/18 1528

## 2018-06-11 NOTE — ED Triage Notes (Signed)
Pt in for possible blood transfusion, she is from out of town and her lab work is being monitored by her PCP in [place] and her Hgb levels have been dropping, reports increased fatigue

## 2018-06-11 NOTE — ED Notes (Signed)
Pt ambulatory to room.

## 2018-06-11 NOTE — ED Notes (Signed)
Patient verbalizes understanding of discharge instructions. Opportunity for questioning and answers were provided. Armband removed by staff, pt discharged from ED ambulatory to home.  

## 2018-06-12 LAB — BPAM RBC
BLOOD PRODUCT EXPIRATION DATE: 202001142359
Blood Product Expiration Date: 202001142359
ISSUE DATE / TIME: 201912201428
ISSUE DATE / TIME: 201912201748
Unit Type and Rh: 5100
Unit Type and Rh: 5100

## 2018-06-12 LAB — TYPE AND SCREEN
ABO/RH(D): O POS
ANTIBODY SCREEN: NEGATIVE
Unit division: 0
Unit division: 0

## 2018-06-14 ENCOUNTER — Emergency Department (HOSPITAL_COMMUNITY)
Admission: EM | Admit: 2018-06-14 | Discharge: 2018-06-14 | Disposition: A | Discharge status: Home / Self Care | Payer: BLUE CROSS/BLUE SHIELD | Attending: Emergency Medicine | Admitting: Emergency Medicine

## 2018-06-14 ENCOUNTER — Encounter (HOSPITAL_COMMUNITY): Payer: Self-pay

## 2018-06-14 ENCOUNTER — Other Ambulatory Visit: Payer: Self-pay

## 2018-06-14 DIAGNOSIS — D469 Myelodysplastic syndrome, unspecified: Secondary | ICD-10-CM | POA: Insufficient documentation

## 2018-06-14 DIAGNOSIS — M069 Rheumatoid arthritis, unspecified: Secondary | ICD-10-CM | POA: Diagnosis not present

## 2018-06-14 DIAGNOSIS — Z79899 Other long term (current) drug therapy: Secondary | ICD-10-CM | POA: Insufficient documentation

## 2018-06-14 DIAGNOSIS — Z452 Encounter for adjustment and management of vascular access device: Secondary | ICD-10-CM

## 2018-06-14 DIAGNOSIS — Z0189 Encounter for other specified special examinations: Secondary | ICD-10-CM

## 2018-06-14 LAB — CBC WITH DIFFERENTIAL/PLATELET
Abs Immature Granulocytes: 0.02 K/uL (ref 0.00–0.07)
Basophils Absolute: 0.1 K/uL (ref 0.0–0.1)
Basophils Relative: 1 %
Eosinophils Absolute: 0.5 K/uL (ref 0.0–0.5)
Eosinophils Relative: 7 %
HCT: 38.7 % (ref 36.0–46.0)
Hemoglobin: 11.7 g/dL — ABNORMAL LOW (ref 12.0–15.0)
Immature Granulocytes: 0 %
Lymphocytes Relative: 38 %
Lymphs Abs: 2.8 K/uL (ref 0.7–4.0)
MCH: 24.2 pg — ABNORMAL LOW (ref 26.0–34.0)
MCHC: 30.2 g/dL (ref 30.0–36.0)
MCV: 80.1 fL (ref 80.0–100.0)
Monocytes Absolute: 0.5 K/uL (ref 0.1–1.0)
Monocytes Relative: 7 %
Neutro Abs: 3.4 K/uL (ref 1.7–7.7)
Neutrophils Relative %: 47 %
Platelets: 350 K/uL (ref 150–400)
RBC: 4.83 MIL/uL (ref 3.87–5.11)
RDW: 17.1 % — ABNORMAL HIGH (ref 11.5–15.5)
WBC: 7.3 K/uL (ref 4.0–10.5)
nRBC: 0 % (ref 0.0–0.2)

## 2018-06-14 LAB — COMPREHENSIVE METABOLIC PANEL WITH GFR
ALT: 20 U/L (ref 0–44)
AST: 33 U/L (ref 15–41)
Albumin: 3.6 g/dL (ref 3.5–5.0)
Alkaline Phosphatase: 83 U/L (ref 38–126)
Anion gap: 9 (ref 5–15)
BUN: 20 mg/dL (ref 8–23)
CO2: 27 mmol/L (ref 22–32)
Calcium: 9.2 mg/dL (ref 8.9–10.3)
Chloride: 104 mmol/L (ref 98–111)
Creatinine, Ser: 0.56 mg/dL (ref 0.44–1.00)
GFR calc Af Amer: >60 mL/min
GFR calc non Af Amer: >60 mL/min
Glucose, Bld: 88 mg/dL (ref 70–99)
Potassium: 3.9 mmol/L (ref 3.5–5.1)
Sodium: 140 mmol/L (ref 135–145)
Total Bilirubin: 0.6 mg/dL (ref 0.3–1.2)
Total Protein: 7.1 g/dL (ref 6.5–8.1)

## 2018-06-14 LAB — SEDIMENTATION RATE: Sed Rate: 10 mm/hr (ref 0–22)

## 2018-06-14 LAB — C-REACTIVE PROTEIN: CRP: <0.8 mg/dL (ref <1.0)

## 2018-06-14 LAB — CK: Total CK: 186 U/L (ref 38–234)

## 2018-06-14 NOTE — Discharge Instructions (Addendum)
You were evaluated today for labs and an assessment of your PICC line. Your labs were negative. Your sed rate was pending at dc. Please follow up with your ID physicians as previously instructed to do or you may return here for additional labs.  We will send your lab work to your ID physician.

## 2018-06-14 NOTE — ED Provider Notes (Signed)
Patient care transferred from [facility] at shift change. See note for full HPI.  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  She reports she had a blood transfusion on 06-11-2018 secondary to symptomatic anemia likely secondary to her MDS.  She feels much better today, is denying any increased fatigue, and is denying any increased pain in the hip, fevers, chills, or other constitutional symptoms.  Patient reports that after completion of lab work, she will need to be faxed to her infectious disease provider in [place]."  At shift change patient pending labs and PICC team assessment. Plan for dc home after labs and PICC team assessment. Lab to be faxed to ID in [place]. Bennie Hind, NP. [phone]  Physical Exam  BP 131/66 (BP Location: Left Arm)   Pulse 89   Temp 98 F (36.7 C) (Oral)   Resp 18   SpO2 98%   Physical Exam Vitals signs and nursing note reviewed.  Constitutional:      General: She is not in acute distress.    Appearance: Normal appearance. She is well-developed. She is not ill-appearing, toxic-appearing or diaphoretic.  HENT:     Head: Atraumatic.  Eyes:     Pupils: Pupils are equal, round, and reactive to light.  Neck:     Musculoskeletal: Normal range of motion and neck supple.  Cardiovascular:     Rate and Rhythm: Normal rate.  Pulmonary:     Effort: Pulmonary effort is normal. No respiratory distress.     Breath sounds: Normal breath sounds. No stridor. No wheezing, rhonchi or rales.  Chest:     Chest wall: No tenderness.  Abdominal:     General: There is no distension.     Palpations: There is no mass.     Tenderness: There is no abdominal tenderness. There is no right CVA tenderness, left CVA tenderness, guarding or  rebound.     Hernia: No hernia is present.  Musculoskeletal: Normal range of motion.     Comments: Moves all extremities without difficulty.  Skin:    General: Skin is warm and dry.     Comments: PICC line in right upper arm without erythema, warmth or drainage  Neurological:     General: No focal deficit present.     Mental Status: She is alert.     Sensory: No sensory deficit.     Motor: No weakness.     Gait: Gait normal.     ED Course/Procedures     Procedures  Labs Reviewed  CBC WITH DIFFERENTIAL/PLATELET - Abnormal; Notable for the following components:      Result Value   Hemoglobin 11.7 (*)    MCH 24.2 (*)    RDW 17.1 (*)    All other components within normal limits  COMPREHENSIVE METABOLIC PANEL  SEDIMENTATION RATE  C-REACTIVE PROTEIN  CK   MDM  62- year-old female who appears otherwise well presents for evaluation of PICC line assessment and labs. Afebrile, non septic, non ill appearing. No lightheadedness or dizziness. States she feels better since blood transfusion on 06/11/18. Labs reassuring. Hgb improved from previous visit. No new complaints. Low suspicion for acute pathology at this time. Discussed ongoing compliance with her abx at home. Patient had PICC team assessment and dressing change. Site look well healing without active signs of infection.  She is hemodynamically stable and appropriate for DC home at this time.  Discussed return precautions.  Patient voiced understanding and is agreeable for follow-up.      Nettie Elm, PA-C 06/14/18 1727    Julianne Rice, MD 06/15/18 [phone]

## 2018-06-14 NOTE — ED Provider Notes (Signed)
[facility] EMERGENCY DEPARTMENT Provider Note   CSN: [id number] Arrival date & time: 06/14/18  1324     History   Chief Complaint Chief Complaint  Patient presents with  . Labs Only    HPI Darlene Fuller is a 62 y.o. female.  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  She reports she had a blood transfusion on 06-11-2018 secondary to symptomatic anemia likely secondary to her MDS.  She feels much better today, is denying any increased fatigue, and is denying any increased pain in the hip, fevers, chills, or other constitutional symptoms.  Patient reports that after completion of lab work, she will need to be faxed to her infectious disease provider in [place].  Past Medical History:  Diagnosis Date  . Arthritis   . MDS (myelodysplastic syndrome) (HCC)     There are no active problems to display for this patient.   Past Surgical History:  Procedure Laterality Date  . TOTAL HIP ARTHROPLASTY       OB History   No obstetric history on file.      Home Medications    Prior to Admission medications   Medication Sig Start Date End Date Taking? Authorizing Provider  Apremilast (OTEZLA) 30 MG TABS Take 30 mg by mouth 2 (two) times daily.    [provider]  busPIRone (BUSPAR) 30 MG tablet Take 30 mg by mouth 2 (two) times daily.    [provider]  calcium-vitamin D (OSCAL WITH D) 500-200 MG-UNIT tablet Take 1 tablet by mouth daily with breakfast.    [provider]  cefadroxil (DURICEF) 500 MG capsule Take 500 mg by mouth 2 (two) times daily.    [provider]  metoprolol (LOPRESSOR) 50 MG tablet Take 50 mg by mouth 2 (two) times daily.    [provider]  Multiple Vitamin  (MULTIVITAMIN WITH MINERALS) TABS tablet Take 1 tablet by mouth daily.    [provider]  pantoprazole (PROTONIX) 40 MG tablet Take 40 mg by mouth daily.    [provider]  QUEtiapine (SEROQUEL XR) 300 MG 24 hr tablet Take 300 mg by mouth at bedtime.    [provider]  QUEtiapine (SEROQUEL) 300 MG tablet Take 300 mg by mouth at bedtime.    [provider]  traMADol (ULTRAM) 50 MG tablet Take 50 mg by mouth every 6 (six) hours as needed for moderate pain.    [provider]    Family History History reviewed. No pertinent family history.  Social History Social History   Tobacco Use  . Smoking status: Never Smoker  . Smokeless tobacco: Never Used  Substance Use Topics  . Alcohol use: Yes    Comment: "occasional"   . Drug use: Not on file     Allergies   Patient has no known allergies.   Review of Systems Review of Systems  Constitutional: Negative for chills, fatigue and fever.  Respiratory: Negative for shortness of breath.   Neurological: Negative for weakness.     Physical Exam Updated Vital Signs BP 131/66 (BP Location: Left Arm)   Pulse 89   Temp 98 F (36.7 C) (Oral)   Resp 18   SpO2 98%   Physical Exam Vitals signs and nursing note reviewed.  Constitutional:  General: She is not in acute distress.    Appearance: She is well-developed. She is not diaphoretic.     Comments: Sitting comfortably in bed.  HENT:     Head: Normocephalic and atraumatic.  Eyes:     General:        Right eye: No discharge.        Left eye: No discharge.     Conjunctiva/sclera: Conjunctivae normal.     Comments: EOMs normal to gross examination.  Neck:     Musculoskeletal: Normal range of motion.  Cardiovascular:     Rate and Rhythm: Normal rate and regular rhythm.     Heart sounds: Normal heart sounds.     Comments: Intact, 2+ radial pulse. Pulmonary:     Effort: Pulmonary effort is normal.     Breath sounds: Normal  breath sounds. No wheezing or rales.  Abdominal:     General: There is no distension.  Musculoskeletal: Normal range of motion.  Skin:    General: Skin is warm and dry.     Comments: PICC line in place in the right upper arm.  No surrounding erythema.  Neurological:     Mental Status: She is alert.     Comments: Cranial nerves intact to gross observation. Patient moves extremities without difficulty.  Psychiatric:        Behavior: Behavior normal.        Thought Content: Thought content normal.        Judgment: Judgment normal.      ED Treatments / Results  Labs (all labs ordered are listed, but only abnormal results are displayed) Labs Reviewed  COMPREHENSIVE METABOLIC PANEL  CBC WITH DIFFERENTIAL/PLATELET  SEDIMENTATION RATE  C-REACTIVE PROTEIN  CK    EKG None  Radiology No results found.  Procedures Procedures (including critical care time)  Medications Ordered in ED Medications - No data to display   Initial Impression / Assessment and Plan / ED Course  I have reviewed the triage vital signs and the nursing notes.  Pertinent labs & imaging results that were available during my care of the patient were reviewed by me and considered in my medical decision making (see chart for details).     Patient is well-appearing and in no acute distress.  Patient is presenting for routine lab work while away from her primary team at [place], [place].  She is currently being treated by infectious disease.  Lab work to be obtained include CBC with differential, CMP, ESR, CRP and CPK.  Upon completion, patient requests that the results be faxed to her infectious these nurse practitioner at [phone].  Patient was on monitor while receiving blood transfusion on 06-12-19 19.  The monitor alarmed for a wide-complex tachycardia, questionable whether it was a couple runs of V. tach versus some other tachycardia.  She has been given follow-up for cardiology per the previous  ED team.  Patient reports no palpitations or other symptoms of chest pain, shortness of breath, or any new or worsening symptoms since this time.  She intends to follow-up with cardiology.  Care signed out to [facility], PA-C at 3:31 PM.  This is a supervised visit with Dr. Julianne Rice. Evaluation, management, and discharge planning discussed with this attending physician.  Final Clinical Impressions(s) / ED Diagnoses   Final diagnoses:  Encounter for laboratory test    ED Discharge Orders    None       Tamala Julian 06/14/18 1532    [place],  Shanon Brow, MD 06/15/18 [phone]

## 2018-06-14 NOTE — ED Triage Notes (Signed)
Pt states she is here for labwork, she has picc line for infected hip replacement. She reports she has weekly lab draws.

## 2018-06-21 ENCOUNTER — Emergency Department (HOSPITAL_COMMUNITY)
Admission: EM | Admit: 2018-06-21 | Discharge: 2018-06-21 | Disposition: A | Discharge status: Home / Self Care | Payer: BLUE CROSS/BLUE SHIELD | Attending: Emergency Medicine | Admitting: Emergency Medicine

## 2018-06-21 DIAGNOSIS — Z79899 Other long term (current) drug therapy: Secondary | ICD-10-CM | POA: Insufficient documentation

## 2018-06-21 DIAGNOSIS — Z96649 Presence of unspecified artificial hip joint: Secondary | ICD-10-CM | POA: Insufficient documentation

## 2018-06-21 DIAGNOSIS — Z0189 Encounter for other specified special examinations: Secondary | ICD-10-CM | POA: Diagnosis present

## 2018-06-21 LAB — CBC WITH DIFFERENTIAL/PLATELET
Abs Immature Granulocytes: 0.03 10*3/uL (ref 0.00–0.07)
Basophils Absolute: 0.1 10*3/uL (ref 0.0–0.1)
Basophils Relative: 1 %
Eosinophils Absolute: 0.5 10*3/uL (ref 0.0–0.5)
Eosinophils Relative: 6 %
HCT: 39.6 % (ref 36.0–46.0)
Hemoglobin: 11.6 g/dL — ABNORMAL LOW (ref 12.0–15.0)
IMMATURE GRANULOCYTES: 0 %
Lymphocytes Relative: 26 %
Lymphs Abs: 2.5 10*3/uL (ref 0.7–4.0)
MCH: 23.5 pg — ABNORMAL LOW (ref 26.0–34.0)
MCHC: 29.3 g/dL — ABNORMAL LOW (ref 30.0–36.0)
MCV: 80.3 fL (ref 80.0–100.0)
Monocytes Absolute: 0.6 10*3/uL (ref 0.1–1.0)
Monocytes Relative: 6 %
Neutro Abs: 6.1 10*3/uL (ref 1.7–7.7)
Neutrophils Relative %: 61 %
Platelets: 307 10*3/uL (ref 150–400)
RBC: 4.93 MIL/uL (ref 3.87–5.11)
RDW: 17.5 % — ABNORMAL HIGH (ref 11.5–15.5)
WBC: 9.8 10*3/uL (ref 4.0–10.5)
nRBC: 0 % (ref 0.0–0.2)

## 2018-06-21 LAB — COMPREHENSIVE METABOLIC PANEL
ALT: 19 U/L (ref 0–44)
AST: 35 U/L (ref 15–41)
Albumin: 3.7 g/dL (ref 3.5–5.0)
Alkaline Phosphatase: 78 U/L (ref 38–126)
Anion gap: 10 (ref 5–15)
BUN: 20 mg/dL (ref 8–23)
CHLORIDE: 102 mmol/L (ref 98–111)
CO2: 26 mmol/L (ref 22–32)
Calcium: 9.2 mg/dL (ref 8.9–10.3)
Creatinine, Ser: 0.63 mg/dL (ref 0.44–1.00)
GFR calc Af Amer: >60 mL/min (ref >60)
GFR calc non Af Amer: >60 mL/min (ref >60)
Glucose, Bld: 104 mg/dL — ABNORMAL HIGH (ref 70–99)
Potassium: 4 mmol/L (ref 3.5–5.1)
Sodium: 138 mmol/L (ref 135–145)
Total Bilirubin: 0.3 mg/dL (ref 0.3–1.2)
Total Protein: 7 g/dL (ref 6.5–8.1)

## 2018-06-21 LAB — SEDIMENTATION RATE: Sed Rate: 10 mm/hr (ref 0–22)

## 2018-06-21 LAB — CK: Total CK: 143 U/L (ref 38–234)

## 2018-06-21 LAB — C-REACTIVE PROTEIN: CRP: <0.8 mg/dL (ref <1.0)

## 2018-06-21 NOTE — Progress Notes (Signed)
Patient PICC dressing changed (biopatch,statlock). PICC placed in [place], [place] for Daptomycin therapy. Therapy to completed this upcoming Friday, January 3,2020. Patient to return to ER at [facility] to have PICC line removed.  Patient to bring in PICC placement card to show actual PICC length at insertion. NO issues reported with PICC line for therapy.

## 2018-06-21 NOTE — Discharge Instructions (Addendum)
Please return to the ED for PICC line removal, if possible in the morning close to 8 am. Continue home medications as prescribed. Please return to the ED for new or worsening symptoms.

## 2018-06-21 NOTE — ED Notes (Signed)
Patient verbalizes understanding of discharge instructions. Opportunity for questioning and answers were provided. Armband removed by staff, pt discharged from ED.  

## 2018-06-21 NOTE — ED Triage Notes (Signed)
Pt here for routine weekly lab work and PICC line dressing change.

## 2018-06-21 NOTE — ED Provider Notes (Signed)
Pt's dressing changed, labs reviewed.  Pt will return here Friday to have PICC line removed.   Fransico Meadow, PA-C 06/21/18 [place], [place], DO 06/22/18 [phone]

## 2018-06-21 NOTE — ED Provider Notes (Signed)
[facility] EMERGENCY DEPARTMENT Provider Note   CSN: [id number] Arrival date & time: 06/21/18  1411     History   Chief Complaint Chief Complaint  Patient presents with  . PICC line change    HPI Bonnee Zertuche is a 62 y.o. 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 She had a recent hip replacement that became infected causing her to get the PICC line for IV antibiotics.   She is from [place], but here taking care of a sick family member so her ID provider has her coming to the ED for lab work and dressing changes. She is followed up there by Bennie Hind NP with [facility]. She did have a blood transfusion for symptomatic anemia on 06/11/2018. She reports feeling well since the transfusion. She has no specific complaints today. Denies fatigue, fevers, chills, hip pain.  Past Medical History:  Diagnosis Date  . Arthritis   . MDS (myelodysplastic syndrome) (HCC)     There are no active problems to display for this patient.   Past Surgical History:  Procedure Laterality Date  . TOTAL HIP ARTHROPLASTY       OB History   No obstetric history on file.      Home Medications    Prior to Admission medications   Medication Sig Start Date End Date Taking? Authorizing Provider  Apremilast (OTEZLA) 30 MG TABS Take 30 mg by mouth 2 (two) times daily.    [provider]  busPIRone (BUSPAR) 30 MG tablet Take 30 mg by mouth 2 (two) times daily.    [provider]  calcium-vitamin D (OSCAL WITH D) 500-200 MG-UNIT tablet Take 1 tablet by mouth daily with breakfast.    [provider]  cefadroxil (DURICEF) 500 MG capsule Take 500 mg by mouth 2 (two) times daily.    [provider]  metoprolol (LOPRESSOR) 50 MG tablet Take 50 mg by mouth 2 (two) times  daily.    [provider]  Multiple Vitamin (MULTIVITAMIN WITH MINERALS) TABS tablet Take 1 tablet by mouth daily.    [provider]  pantoprazole (PROTONIX) 40 MG tablet Take 40 mg by mouth daily.    [provider]  QUEtiapine (SEROQUEL XR) 300 MG 24 hr tablet Take 300 mg by mouth at bedtime.    [provider]  QUEtiapine (SEROQUEL) 300 MG tablet Take 300 mg by mouth at bedtime.    [provider]  traMADol (ULTRAM) 50 MG tablet Take 50 mg by mouth every 6 (six) hours as needed for moderate pain.    [provider]    Family History No family history on file.  Social History Social History   Tobacco Use  . Smoking status: Never Smoker  . Smokeless tobacco: Never Used  Substance Use Topics  . Alcohol use: Yes    Comment: "occasional"   . Drug use: Not on file     Allergies   Patient has no known allergies.   Review of Systems Review of Systems  Constitutional: Negative for activity change, chills, fatigue and fever.  Respiratory: Negative for chest tightness and shortness of breath.   Cardiovascular: Negative for chest pain, palpitations and leg swelling.  Gastrointestinal: Negative for abdominal pain, blood in stool, constipation, diarrhea and nausea.  Endocrine: Negative for cold intolerance and heat intolerance.  Genitourinary:  Negative for dysuria.  Neurological: Negative for dizziness, syncope and weakness.  All other systems reviewed and are negative.    Physical Exam Updated Vital Signs BP (!) 141/84 (BP Location: Left Arm)   Pulse 81   Temp 97.9 F (36.6 C) (Oral)   Resp 18   SpO2 100%   Physical Exam Vitals signs and nursing note reviewed.  Constitutional:      General: She is not in acute distress.    Appearance: Normal appearance.  HENT:     Head: Normocephalic and atraumatic.  Eyes:     Conjunctiva/sclera: Conjunctivae normal.     Comments: EOM intact to gross examination  Cardiovascular:       Rate and Rhythm: Normal rate and regular rhythm.     Pulses: Normal pulses.          Radial pulses are 2+ on the right side and 2+ on the left side.     Heart sounds: Normal heart sounds.  Pulmonary:     Effort: Pulmonary effort is normal.     Breath sounds: Normal breath sounds.  Abdominal:     Palpations: Abdomen is soft.     Tenderness: There is no abdominal tenderness.  Musculoskeletal:     Comments: Full ROM in all extremities  Skin:    General: Skin is warm and dry.     Capillary Refill: Capillary refill takes less than 2 seconds.     Findings: No rash.     Comments: PICC line in place in right upper arm. No surrounding erythema or warmth  Neurological:     Mental Status: She is alert.  Psychiatric:        Judgment: Judgment normal.      ED Treatments / Results  Labs (all labs ordered are listed, but only abnormal results are displayed) Labs Reviewed  CBC WITH DIFFERENTIAL/PLATELET  COMPREHENSIVE METABOLIC PANEL  SEDIMENTATION RATE  C-REACTIVE PROTEIN  CK    EKG None  Radiology No results found.  Procedures Procedures (including critical care time)  Medications Ordered in ED Medications - No data to display   Initial Impression / Assessment and Plan / ED Course  I have reviewed the triage vital signs and the nursing notes.  Pertinent labs & imaging results that were available during my care of the patient were reviewed by me and considered in my medical decision making (see chart for details).    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 She is visiting from [place]. Pt is well appearing and in no acute distress. Her lab work today includes CBC w/ differential, CMP, ESR, CRP, and CPK. Her labs were unremarkable.  Pt will need PICC line removed on or after Friday 06/25/2018. She is here taking care of  a sick family member and was told she is unable to enroll in home health services because her length of stay in [place] is "too short." We felt the best way for her to have this removed is to have her return to the ED for the IV team to remove the PICC line.   We will fax her lab results today to [facility] ATT: Bennie Hind NP  Pt is stable at time of discharge and given strict return precautions. Pt agrees with this plan and has no further questions.  Final Clinical Impressions(s) / ED  Diagnoses   Final diagnoses:  None    ED Discharge Orders    None       Cherre Robins, PA-C 06/22/18 [place], [place], DO 06/24/18 [id number]

## 2018-06-25 ENCOUNTER — Emergency Department (HOSPITAL_COMMUNITY)
Admission: EM | Admit: 2018-06-25 | Discharge: 2018-06-25 | Disposition: A | Discharge status: Home / Self Care | Payer: BLUE CROSS/BLUE SHIELD | Attending: Emergency Medicine | Admitting: Emergency Medicine

## 2018-06-25 DIAGNOSIS — Z79899 Other long term (current) drug therapy: Secondary | ICD-10-CM | POA: Insufficient documentation

## 2018-06-25 DIAGNOSIS — Z452 Encounter for adjustment and management of vascular access device: Secondary | ICD-10-CM | POA: Diagnosis present

## 2018-06-25 DIAGNOSIS — D469 Myelodysplastic syndrome, unspecified: Secondary | ICD-10-CM | POA: Insufficient documentation

## 2018-06-25 NOTE — ED Provider Notes (Signed)
[facility] EMERGENCY DEPARTMENT Provider Note   CSN: [id number] Arrival date & time: 06/25/18  0749     History   Chief Complaint No chief complaint on file.   HPI Darlene Fuller is a 63 y.o. female with a past medical history of infected hip replacement, myelodysplastic syndrome, PICC line in right upper extremity who presents today for evaluation of the PICC line removal.  She denies any recent drainage, increased pain, no symptoms.  No shortness of breath, fevers nausea or vomiting.  She reports generally feeling well.  She is followed by Cheron Every, NP at [facility] is here visiting her mother who was recently hospitalized.  She has presented to the ED multiple times for PICC line dressing changes and lab work according to chart review.  Chart review has note stating that plan is to have PICC line removed today.  HPI  Past Medical History:  Diagnosis Date  . Arthritis   . MDS (myelodysplastic syndrome) (HCC)     There are no active problems to display for this patient.   Past Surgical History:  Procedure Laterality Date  . TOTAL HIP ARTHROPLASTY       OB History   No obstetric history on file.      Home Medications    Prior to Admission medications   Medication Sig Start Date End Date Taking? Authorizing Provider  Apremilast (OTEZLA) 30 MG TABS Take 30 mg by mouth 2 (two) times daily.    [provider]  busPIRone (BUSPAR) 30 MG tablet Take 30 mg by mouth 2 (two) times daily.    [provider]  calcium-vitamin D (OSCAL WITH D) 500-200 MG-UNIT tablet Take 1 tablet by mouth daily with breakfast.    [provider]  cefadroxil (DURICEF) 500 MG capsule Take 500 mg by mouth 2 (two) times daily.    [provider]  metoprolol (LOPRESSOR) 50 MG tablet Take 50 mg by mouth 2 (two) times daily.    [provider]  Multiple Vitamin (MULTIVITAMIN WITH MINERALS) TABS tablet Take 1  tablet by mouth daily.    [provider]  pantoprazole (PROTONIX) 40 MG tablet Take 40 mg by mouth daily.    [provider]  QUEtiapine (SEROQUEL XR) 300 MG 24 hr tablet Take 300 mg by mouth at bedtime.    [provider]  QUEtiapine (SEROQUEL) 300 MG tablet Take 300 mg by mouth at bedtime.    [provider]  traMADol (ULTRAM) 50 MG tablet Take 50 mg by mouth every 6 (six) hours as needed for moderate pain.    [provider]    Family History No family history on file.  Social History Social History   Tobacco Use  . Smoking status: Never Smoker  . Smokeless tobacco: Never Used  Substance Use Topics  . Alcohol use: Yes    Comment: "occasional"   . Drug use: Not on file     Allergies   Patient has no known allergies.   Review of Systems Review of Systems  Constitutional: Negative for chills and fever.  Skin: Negative for color change.  All other systems reviewed and are negative.    Physical Exam Updated Vital Signs BP 122/75 (BP Location: Left Arm)   Pulse 73   Temp 98.9 F (37.2 C) (Oral)   Resp 16   SpO2 99%   Physical Exam Vitals signs and nursing note reviewed.  Constitutional:      General:  She is not in acute distress.    Appearance: She is not ill-appearing.  HENT:     Head: Normocephalic.  Cardiovascular:     Rate and Rhythm: Normal rate.     Comments: Right hand is warm and well perfused.  2+ right radial pulse. Pulmonary:     Effort: Pulmonary effort is normal. No respiratory distress.  Musculoskeletal:     Comments: 5/5 strength in right upper extremity, full pain-free range of motion.  Skin:    Comments: Dressing intact to right upper extremity where PICC line was already removed.  Neurological:     Mental Status: She is alert.     Comments: Sensation intact to right upper extremity.      ED Treatments / Results  Labs (all labs ordered are listed, but only abnormal results are  displayed) Labs Reviewed - No data to display  EKG None  Radiology No results found.  Procedures Procedures (including critical care time)  Medications Ordered in ED Medications - No data to display   Initial Impression / Assessment and Plan / ED Course  I have reviewed the triage vital signs and the nursing notes.  Pertinent labs & imaging results that were available during my care of the patient were reviewed by me and considered in my medical decision making (see chart for details).    Patient presents today requesting PICC line removal.  At the time of my evaluation her PICC line have already been removed.  She has no complaints, is requesting discharge home.    Return precautions were discussed with patient who states their understanding.  At the time of discharge patient denied any unaddressed complaints or concerns.  Patient is agreeable for discharge home.    Final Clinical Impressions(s) / ED Diagnoses   Final diagnoses:  PICC (peripherally inserted central catheter) removal    ED Discharge Orders    None       Ollen Gross 06/25/18 2924    Quintella Reichert, MD 06/28/18 [phone]

## 2018-06-25 NOTE — ED Triage Notes (Signed)
Pt here from home , pt was told to come to the ED so IV team could come take out her PICC line

## 2018-06-25 NOTE — Discharge Instructions (Signed)
If you have any concerns please return to the emergency room.

## 2019-08-18 IMAGING — DX DG CHEST 1V
1 series · 1 of 1 positions shown · non-contrast
Comparison: None.

CLINICAL DATA: PICC placement

EXAM:
CHEST  1 VIEW

[chest]
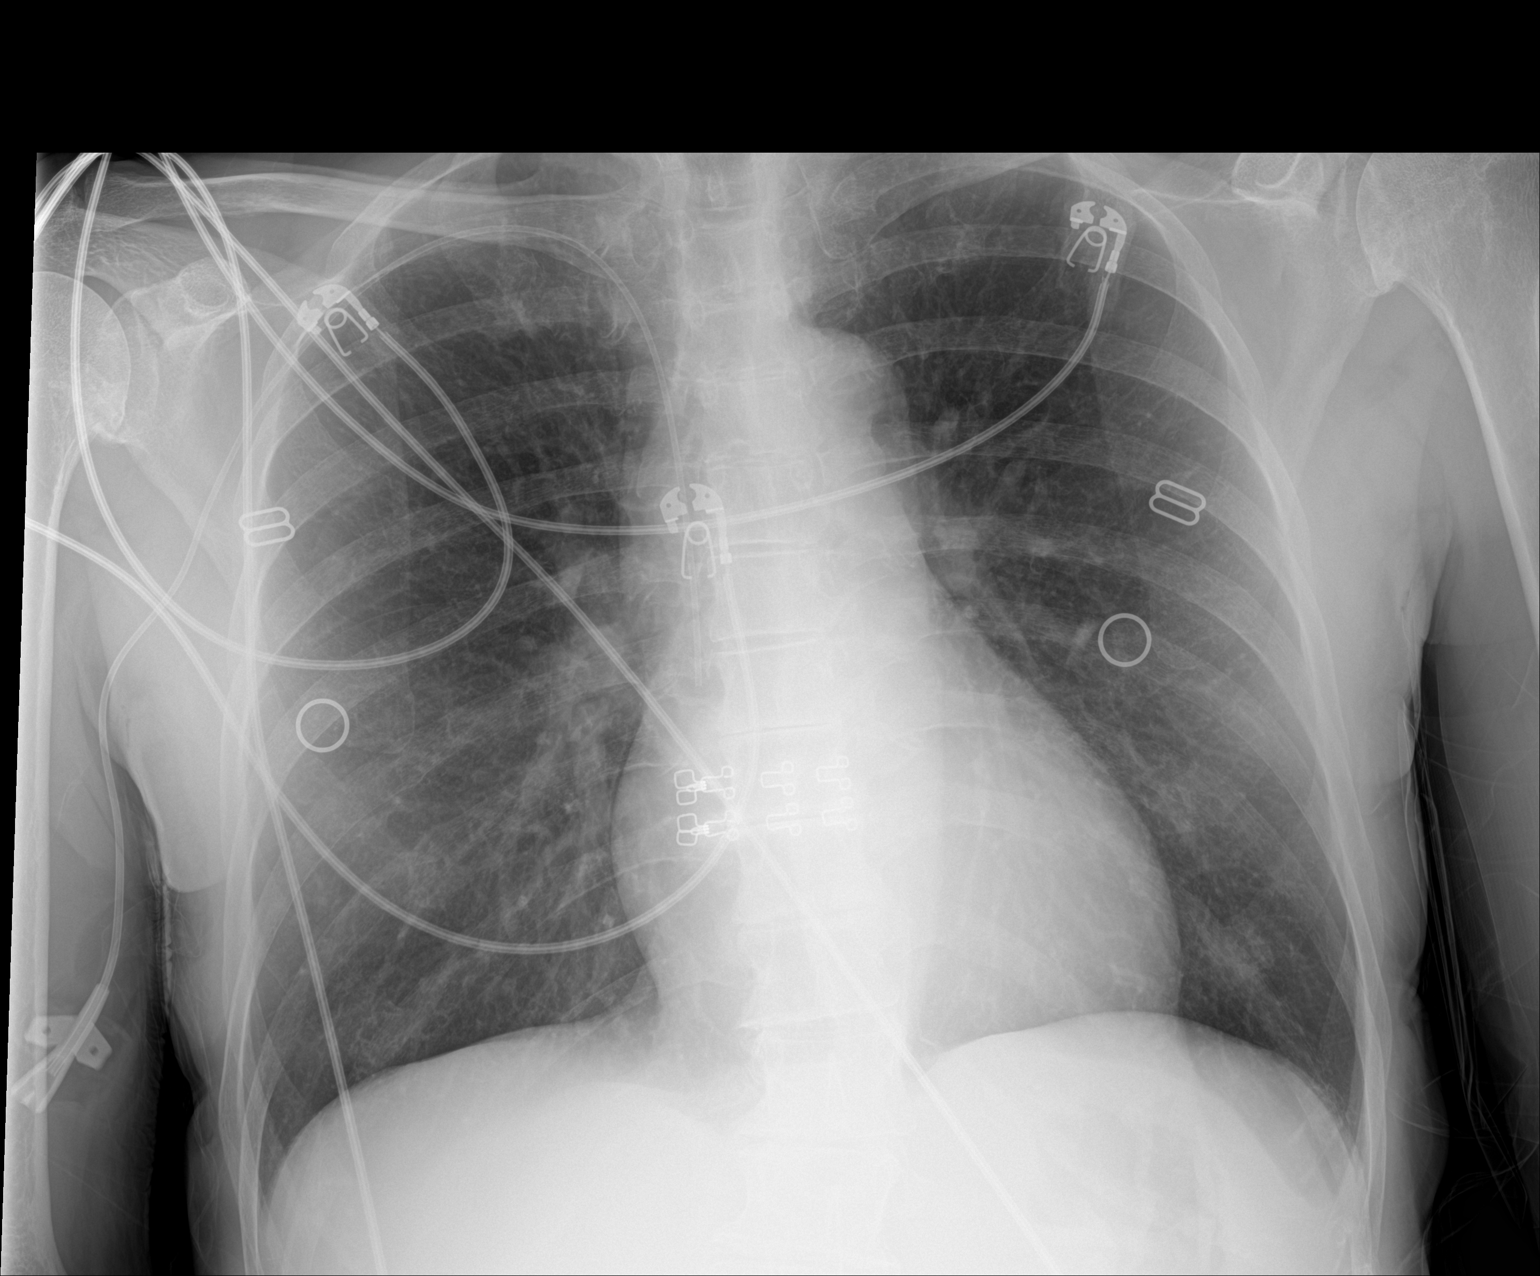

[1 of 1 positions shown; findings below may reference images not displayed]

FINDINGS: Right arm PICC tip at the cavoatrial junction in good position.
Lungs are clear without infiltrate effusion or edema.
IMPRESSION: Right arm PICC tip at the cavoatrial junction.
